# Patient Record
Sex: Female | Born: 1955
Health system: Southern US, Community
[De-identification: ages and names within clinical notes are randomized; demographics above are authoritative.]

## PROBLEM LIST (undated history)

## (undated) DIAGNOSIS — Z87442 Personal history of urinary calculi: Secondary | ICD-10-CM

## (undated) HISTORY — DX: Personal history of urinary calculi: Z87.442

---

## 2007-05-23 ENCOUNTER — Ambulatory Visit (HOSPITAL_COMMUNITY): Admission: RE | Admit: 2007-05-23 | Discharge: 2007-05-23 | Payer: Self-pay | Admitting: Urology

## 2008-07-22 IMAGING — CR DG ABDOMEN 1V
1 series · 1 of 1 positions shown · non-contrast
Comparison: none

CLINICAL DATA: Distal right ureteral stone.  Pre-op workup.
ABDOMEN ? 1 VIEW ? 05/23/07: 
No prior radiograph for correlation.

[view not recorded]
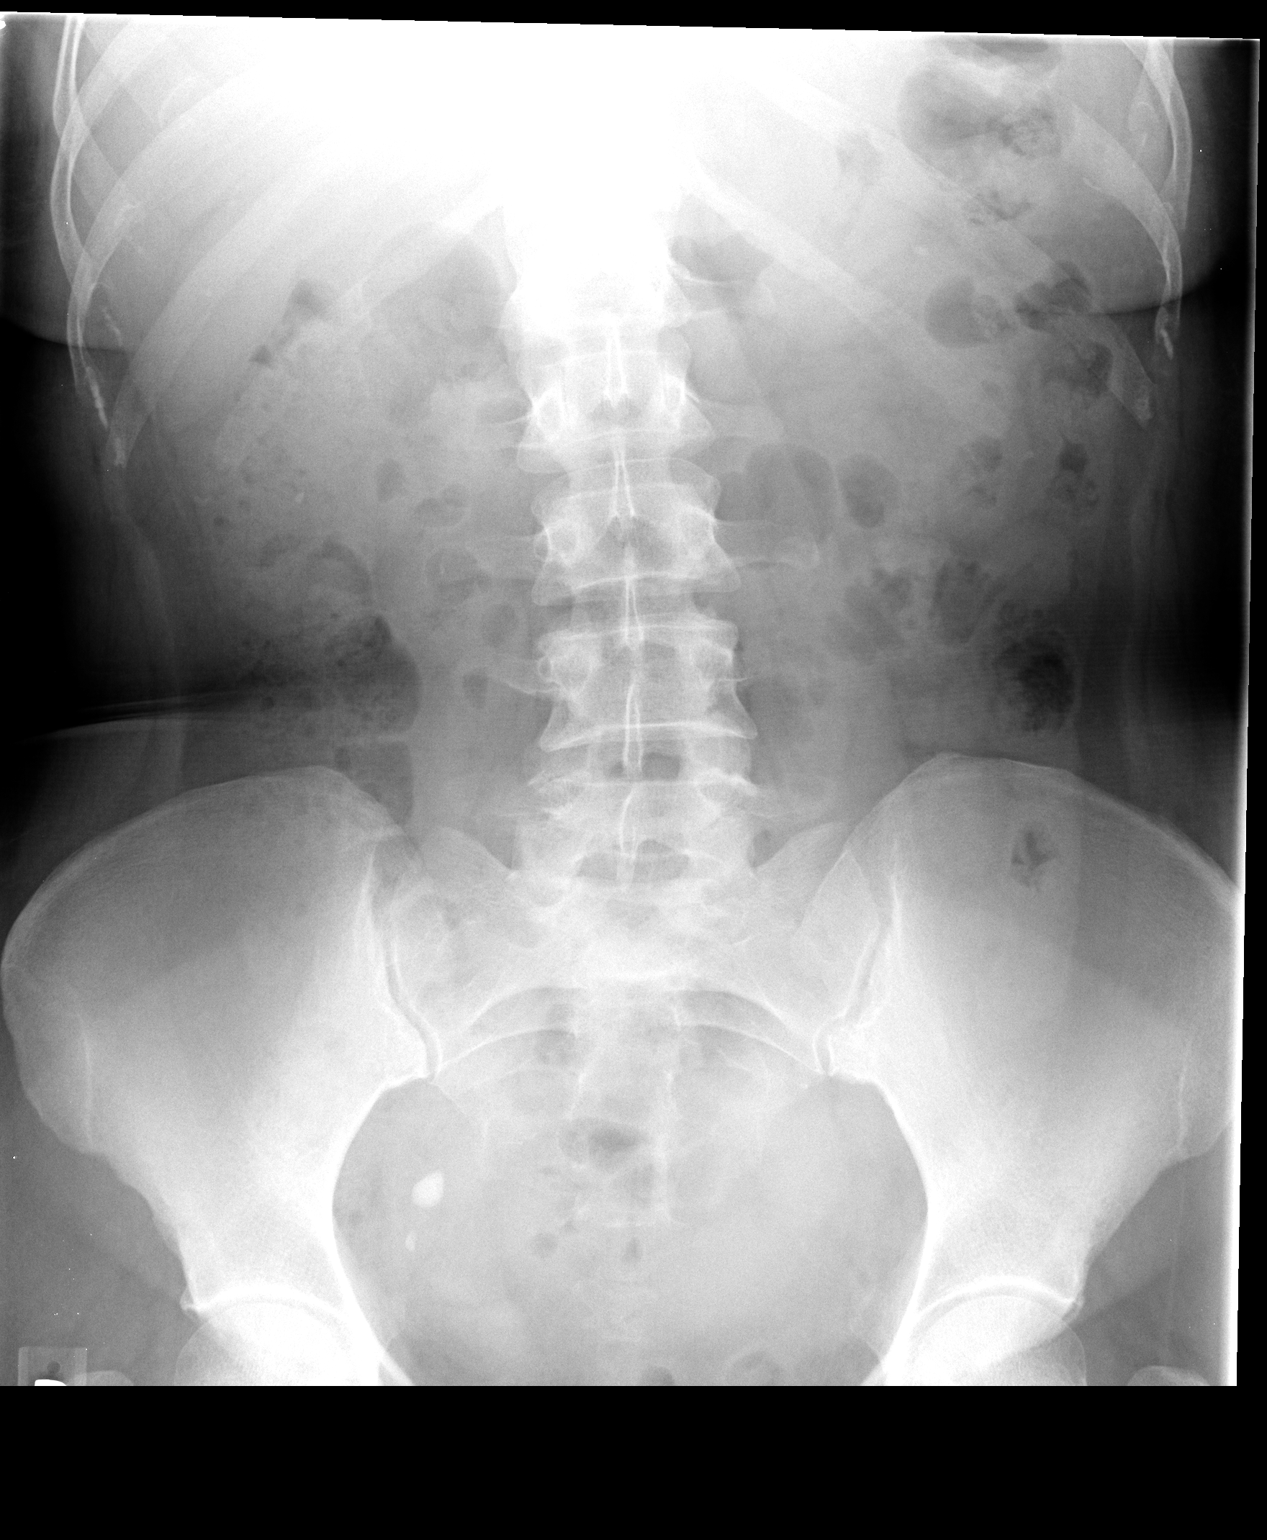

[1 of 1 positions shown; findings below may reference images not displayed]

FINDINGS: A 1x3mm calcification in the right mid to upper abdomen may be related to the right kidney.  A 3 x 4 mm calcification in the left upper quadrant may lie within the left kidney.  There are two calcifications in the right true pelvis, one measuring 8 x 11 mm and the other measuring 3 x 5 mm.  These may represent distal right ureteral stones and/or phleboliths.  The bowel gas pattern is unremarkable.   The bony structures are intact.
IMPRESSION: Suspicion for small bilateral renal calculi and distal right ureteral calculi.

## 2010-12-30 LAB — BASIC METABOLIC PANEL
CO2: 28
Chloride: 106
Glucose, Bld: 100 — ABNORMAL HIGH
Potassium: 4
Sodium: 140

## 2010-12-30 LAB — URINE MICROSCOPIC-ADD ON

## 2010-12-30 LAB — CBC
HCT: 39.8
Hemoglobin: 13.7
MCHC: 34.5
MCV: 87.5
RDW: 12.4

## 2010-12-30 LAB — URINALYSIS, ROUTINE W REFLEX MICROSCOPIC
Bilirubin Urine: NEGATIVE
Protein, ur: NEGATIVE
Urobilinogen, UA: 0.2

## 2017-05-02 DIAGNOSIS — N200 Calculus of kidney: Secondary | ICD-10-CM | POA: Insufficient documentation

## 2017-05-02 DIAGNOSIS — R310 Gross hematuria: Secondary | ICD-10-CM | POA: Insufficient documentation

## 2017-05-14 DIAGNOSIS — N201 Calculus of ureter: Secondary | ICD-10-CM | POA: Diagnosis not present

## 2017-05-14 DIAGNOSIS — N210 Calculus in bladder: Secondary | ICD-10-CM | POA: Diagnosis not present

## 2017-05-14 DIAGNOSIS — R310 Gross hematuria: Secondary | ICD-10-CM | POA: Diagnosis not present

## 2017-05-22 DIAGNOSIS — N200 Calculus of kidney: Secondary | ICD-10-CM | POA: Diagnosis not present

## 2017-05-22 DIAGNOSIS — N1330 Unspecified hydronephrosis: Secondary | ICD-10-CM | POA: Diagnosis not present

## 2017-05-22 DIAGNOSIS — Z9889 Other specified postprocedural states: Secondary | ICD-10-CM | POA: Diagnosis not present

## 2017-05-22 DIAGNOSIS — R8290 Unspecified abnormal findings in urine: Secondary | ICD-10-CM | POA: Diagnosis not present

## 2017-05-22 DIAGNOSIS — N3000 Acute cystitis without hematuria: Secondary | ICD-10-CM | POA: Insufficient documentation

## 2017-05-22 DIAGNOSIS — N201 Calculus of ureter: Secondary | ICD-10-CM | POA: Diagnosis not present

## 2017-06-01 DIAGNOSIS — N200 Calculus of kidney: Secondary | ICD-10-CM | POA: Diagnosis not present

## 2017-06-14 DIAGNOSIS — Z87442 Personal history of urinary calculi: Secondary | ICD-10-CM | POA: Diagnosis not present

## 2017-07-31 DIAGNOSIS — Z87442 Personal history of urinary calculi: Secondary | ICD-10-CM | POA: Diagnosis not present

## 2017-07-31 DIAGNOSIS — R82994 Hypercalciuria: Secondary | ICD-10-CM | POA: Insufficient documentation

## 2017-09-25 DIAGNOSIS — R87612 Low grade squamous intraepithelial lesion on cytologic smear of cervix (LGSIL): Secondary | ICD-10-CM | POA: Diagnosis not present

## 2017-09-25 DIAGNOSIS — Z0000 Encounter for general adult medical examination without abnormal findings: Secondary | ICD-10-CM | POA: Diagnosis not present

## 2017-09-30 DIAGNOSIS — Z0000 Encounter for general adult medical examination without abnormal findings: Secondary | ICD-10-CM | POA: Insufficient documentation

## 2017-11-06 DIAGNOSIS — R87610 Atypical squamous cells of undetermined significance on cytologic smear of cervix (ASC-US): Secondary | ICD-10-CM | POA: Diagnosis not present

## 2017-11-06 DIAGNOSIS — Z124 Encounter for screening for malignant neoplasm of cervix: Secondary | ICD-10-CM | POA: Diagnosis not present

## 2017-11-06 DIAGNOSIS — Z01419 Encounter for gynecological examination (general) (routine) without abnormal findings: Secondary | ICD-10-CM | POA: Diagnosis not present

## 2017-11-06 DIAGNOSIS — Z6829 Body mass index (BMI) 29.0-29.9, adult: Secondary | ICD-10-CM | POA: Diagnosis not present

## 2017-11-12 DIAGNOSIS — M8588 Other specified disorders of bone density and structure, other site: Secondary | ICD-10-CM | POA: Diagnosis not present

## 2017-11-12 DIAGNOSIS — N959 Unspecified menopausal and perimenopausal disorder: Secondary | ICD-10-CM | POA: Diagnosis not present

## 2018-01-09 DIAGNOSIS — Z1231 Encounter for screening mammogram for malignant neoplasm of breast: Secondary | ICD-10-CM | POA: Diagnosis not present

## 2018-01-09 DIAGNOSIS — Z6829 Body mass index (BMI) 29.0-29.9, adult: Secondary | ICD-10-CM | POA: Diagnosis not present

## 2018-01-25 DIAGNOSIS — Z23 Encounter for immunization: Secondary | ICD-10-CM | POA: Diagnosis not present

## 2018-04-24 DIAGNOSIS — R350 Frequency of micturition: Secondary | ICD-10-CM | POA: Diagnosis not present

## 2018-04-24 DIAGNOSIS — N393 Stress incontinence (female) (male): Secondary | ICD-10-CM | POA: Diagnosis not present

## 2018-04-24 DIAGNOSIS — Z87442 Personal history of urinary calculi: Secondary | ICD-10-CM | POA: Diagnosis not present

## 2018-04-24 DIAGNOSIS — N8112 Cystocele, lateral: Secondary | ICD-10-CM | POA: Diagnosis not present

## 2018-05-01 DIAGNOSIS — J101 Influenza due to other identified influenza virus with other respiratory manifestations: Secondary | ICD-10-CM | POA: Diagnosis not present

## 2018-05-01 DIAGNOSIS — R05 Cough: Secondary | ICD-10-CM | POA: Diagnosis not present

## 2018-05-01 DIAGNOSIS — Z20828 Contact with and (suspected) exposure to other viral communicable diseases: Secondary | ICD-10-CM | POA: Diagnosis not present

## 2018-05-01 DIAGNOSIS — R509 Fever, unspecified: Secondary | ICD-10-CM | POA: Diagnosis not present

## 2018-05-09 DIAGNOSIS — Z01810 Encounter for preprocedural cardiovascular examination: Secondary | ICD-10-CM | POA: Diagnosis not present

## 2018-05-09 DIAGNOSIS — I517 Cardiomegaly: Secondary | ICD-10-CM | POA: Diagnosis not present

## 2018-05-10 DIAGNOSIS — Z539 Procedure and treatment not carried out, unspecified reason: Secondary | ICD-10-CM | POA: Diagnosis not present

## 2018-05-10 DIAGNOSIS — N8112 Cystocele, lateral: Secondary | ICD-10-CM | POA: Diagnosis not present

## 2018-05-11 HISTORY — PX: BLADDER SUSPENSION: SHX72

## 2018-05-15 DIAGNOSIS — D696 Thrombocytopenia, unspecified: Secondary | ICD-10-CM | POA: Diagnosis not present

## 2018-05-17 DIAGNOSIS — D696 Thrombocytopenia, unspecified: Secondary | ICD-10-CM | POA: Diagnosis not present

## 2018-05-22 DIAGNOSIS — N8112 Cystocele, lateral: Secondary | ICD-10-CM | POA: Diagnosis not present

## 2018-05-22 DIAGNOSIS — N393 Stress incontinence (female) (male): Secondary | ICD-10-CM | POA: Diagnosis not present

## 2018-05-23 DIAGNOSIS — N8112 Cystocele, lateral: Secondary | ICD-10-CM | POA: Diagnosis not present

## 2018-05-23 DIAGNOSIS — N393 Stress incontinence (female) (male): Secondary | ICD-10-CM | POA: Diagnosis not present

## 2018-06-05 DIAGNOSIS — Z87442 Personal history of urinary calculi: Secondary | ICD-10-CM | POA: Diagnosis not present

## 2018-06-05 DIAGNOSIS — Z87448 Personal history of other diseases of urinary system: Secondary | ICD-10-CM | POA: Insufficient documentation

## 2018-09-06 DIAGNOSIS — Z87442 Personal history of urinary calculi: Secondary | ICD-10-CM | POA: Diagnosis not present

## 2018-09-06 DIAGNOSIS — Z87448 Personal history of other diseases of urinary system: Secondary | ICD-10-CM | POA: Diagnosis not present

## 2018-09-23 DIAGNOSIS — W540XXA Bitten by dog, initial encounter: Secondary | ICD-10-CM | POA: Diagnosis not present

## 2018-09-23 DIAGNOSIS — Z23 Encounter for immunization: Secondary | ICD-10-CM | POA: Diagnosis not present

## 2018-09-23 DIAGNOSIS — S80812A Abrasion, left lower leg, initial encounter: Secondary | ICD-10-CM | POA: Diagnosis not present

## 2018-11-07 ENCOUNTER — Encounter: Payer: Self-pay | Admitting: Gastroenterology

## 2018-12-20 ENCOUNTER — Ambulatory Visit (AMBULATORY_SURGERY_CENTER): Payer: Self-pay | Admitting: *Deleted

## 2018-12-20 ENCOUNTER — Encounter: Payer: Self-pay | Admitting: Gastroenterology

## 2018-12-20 ENCOUNTER — Other Ambulatory Visit: Payer: Self-pay

## 2018-12-20 VITALS — Temp 97.3°F | Ht 62.0 in | Wt 160.0 lb

## 2018-12-20 DIAGNOSIS — Z1211 Encounter for screening for malignant neoplasm of colon: Secondary | ICD-10-CM

## 2018-12-20 MED ORDER — CLENPIQ 10-3.5-12 MG-GM -GM/160ML PO SOLN: 1.0000 | ORAL | 0 refills | Status: DC

## 2018-12-20 NOTE — Progress Notes (Signed)
Patient is here in-person for PV. Patient denies any allergies to eggs or soy. Patient denies any problems with anesthesia/sedation. Patient denies any oxygen use at home. Patient denies taking any diet/weight loss medications or blood thinners. EMMI education assisgned to patient on colonoscopy, this was explained and instructions given to patient. Clenpiq coupon given to pt. BUN/Creat. Is normal per care everywhere.    Pt is aware that care partner will wait in the car during procedure; if they feel like they will be too hot to wait in the car; they may wait in the lobby.  We want them to wear a mask (we do not have any that we can provide them), practice social distancing, and we will check their temperatures when they get here.  I did remind patient that their care partner needs to stay in the parking lot the entire time. Pt will wear mask into building.

## 2019-01-02 ENCOUNTER — Telehealth: Payer: Self-pay

## 2019-01-02 NOTE — Telephone Encounter (Signed)
Covid-19 screening questions   Do you now or have you had a fever in the last 14 days? NO   Do you have any respiratory symptoms of shortness of breath or cough now or in the last 14 days? NO  Do you have any family members or close contacts with diagnosed or suspected Covid-19 in the past 14 days? NO  Have you been tested for Covid-19 and found to be positive? NO        

## 2019-01-03 ENCOUNTER — Encounter: Payer: Self-pay | Admitting: Gastroenterology

## 2019-01-03 ENCOUNTER — Ambulatory Visit (AMBULATORY_SURGERY_CENTER): Payer: Federal, State, Local not specified - PPO | Admitting: Gastroenterology

## 2019-01-03 ENCOUNTER — Other Ambulatory Visit: Payer: Self-pay

## 2019-01-03 VITALS — BP 114/80 | HR 90 | Temp 97.7°F | Resp 15 | Ht 62.0 in | Wt 160.0 lb

## 2019-01-03 DIAGNOSIS — Z1211 Encounter for screening for malignant neoplasm of colon: Secondary | ICD-10-CM | POA: Diagnosis not present

## 2019-01-03 MED ORDER — SODIUM CHLORIDE 0.9 % IV SOLN: 500.0000 mL | Freq: Once | INTRAVENOUS | Status: DC

## 2019-01-03 NOTE — Op Note (Signed)
[facility] Patient Name: Sally NorrieJoyce Honeycutt Procedure Date: 01/03/2019 10:01 AM MRN: [id number] Endoscopist: Lynann Bolognaajesh Tereka Thorley , MD Age: 63 Referring MD:  Date of Birth: 04/05/1956 Gender: Female Account #: [id number] Procedure:                Colonoscopy Indications:              Screening for colorectal malignant neoplasm Medicines:                Monitored Anesthesia Care Procedure:                Pre-Anesthesia Assessment:                           - Prior to the procedure, a History and Physical                            was performed, and patient medications and                            allergies were reviewed. The patient's tolerance of                            previous anesthesia was also reviewed. The risks                            and benefits of the procedure and the sedation                            options and risks were discussed with the patient.                            All questions were answered, and informed consent                            was obtained. Prior Anticoagulants: The patient has                            taken no previous anticoagulant or antiplatelet                            agents. ASA Grade Assessment: I - A normal, healthy                            patient. After reviewing the risks and benefits,                            the patient was deemed in satisfactory condition to                            undergo the procedure.                           After obtaining informed consent, the colonoscope  was passed under direct vision. Throughout the                            procedure, the patient's blood pressure, pulse, and                            oxygen saturations were monitored continuously. The                            Colonoscope was introduced through the anus and                            advanced to the the cecum, identified by                            appendiceal orifice and ileocecal valve.  Thereafter                            2 cm of TI was intubated. The colonoscopy was                            performed without difficulty. The patient tolerated                            the procedure well. The quality of the bowel                            preparation was good. The terminal ileum, ileocecal                            valve, appendiceal orifice, and rectum were                            photographed. Scope In: 10:05:32 AM Scope Out: 10:19:15 AM Scope Withdrawal Time: 0 hours 10 minutes 11 seconds  Total Procedure Duration: 0 hours 13 minutes 43 seconds  Findings:                 Multiple medium-mouthed diverticula were found in                            the sigmoid colon and descending colon. Few                            diverticula had stool impacted. There is no                            endoscopic evidence of diverticulitis. There was                            some luminal narrowing consistent with muscular                            hypertrophy in the sigmoid colon.  Non-bleeding internal hemorrhoids were found during                            retroflexion. The hemorrhoids were small.                           The exam was otherwise without abnormality on                            direct and retroflexion views. Complications:            No immediate complications. Estimated Blood Loss:     Estimated blood loss: none. Impression:               -Moderate left colonic diverticulosis.                           -Small internal hemorrhoids.                           -Otherwise normal colonoscopy to TI. Recommendation:           - Patient has a contact number available for                            emergencies. The signs and symptoms of potential                            delayed complications were discussed with the                            patient. Return to normal activities tomorrow.                            Written discharge  instructions were provided to the                            patient.                           - High fiber diet.                           - Continue present medications.                           - Repeat colonoscopy in 10 years for screening                            purposes. Earlier, if with any new problems or if                            there is any change in family history.                           - Return to GI office PRN. Lynann Bologna, MD 01/03/2019 10:28:58 AM This report has been signed electronically.

## 2019-01-03 NOTE — Progress Notes (Signed)
KA - Temp CW- VS  Pt's states no medical or surgical changes since previsit or office visit.   

## 2019-01-03 NOTE — Progress Notes (Signed)
Report to PACU, RN, vss, BBS= Clear.  

## 2019-01-03 NOTE — Patient Instructions (Signed)
YOU HAD AN ENDOSCOPIC PROCEDURE TODAY AT THE [facility]:   Refer to the procedure report that was given to you for any specific questions about what was found during the examination.  If the procedure report does not answer your questions, please call your gastroenterologist to clarify.  If you requested that your care partner not be given the details of your procedure findings, then the procedure report has been included in a sealed envelope for you to review at your convenience later.  YOU SHOULD EXPECT: Some feelings of bloating in the abdomen. Passage of more gas than usual.  Walking can help get rid of the air that was put into your GI tract during the procedure and reduce the bloating. If you had a lower endoscopy (such as a colonoscopy or flexible sigmoidoscopy) you may notice spotting of blood in your stool or on the toilet paper. If you underwent a bowel prep for your procedure, you may not have a normal bowel movement for a few days.  Please Note:  You might notice some irritation and congestion in your nose or some drainage.  This is from the oxygen used during your procedure.  There is no need for concern and it should clear up in a day or so.  SYMPTOMS TO REPORT IMMEDIATELY:   Following lower endoscopy (colonoscopy or flexible sigmoidoscopy):  Excessive amounts of blood in the stool  Significant tenderness or worsening of abdominal pains  Swelling of the abdomen that is new, acute  Fever of 100F or higher  For urgent or emergent issues, a gastroenterologist can be reached at any hour by calling [phone].   DIET:  We do recommend a small meal at first, but then you may proceed to your regular diet.  Drink plenty of fluids but you should avoid alcoholic beverages for 24 hours.  ACTIVITY:  You should plan to take it easy for the rest of today and you should NOT DRIVE or use heavy machinery until tomorrow (because of the sedation medicines used during the test).     FOLLOW UP: Our staff will call the number listed on your records 48-72 hours following your procedure to check on you and address any questions or concerns that you may have regarding the information given to you following your procedure. If we do not reach you, we will leave a message.  We will attempt to reach you two times.  During this call, we will ask if you have developed any symptoms of COVID 19. If you develop any symptoms (ie: fever, flu-like symptoms, shortness of breath, cough etc.) before then, please call [phone].  If you test positive for Covid 19 in the 2 weeks post procedure, please call and report this information to us.    If any biopsies were taken you will be contacted by phone or by letter within the next 1-3 weeks.  Please call us at [phone] if you have not heard about the biopsies in 3 weeks.    SIGNATURES/CONFIDENTIALITY: You and/or your care partner have signed paperwork which will be entered into your electronic medical record.  These signatures attest to the fact that that the information above on your After Visit Summary has been reviewed and is understood.  Full responsibility of the confidentiality of this discharge information lies with you and/or your care-partner. 

## 2019-01-07 ENCOUNTER — Telehealth: Payer: Self-pay | Admitting: *Deleted

## 2019-01-07 NOTE — Telephone Encounter (Signed)
  Follow up Call-  Call back number 01/03/2019  Post procedure Call Back phone  # [phone] hm  Permission to leave phone message Yes  Some recent data might be hidden     Patient questions:  Do you have a fever, pain , or abdominal swelling? No. Pain Score  0 *  Have you tolerated food without any problems? Yes.    Have you been able to return to your normal activities? Yes.    Do you have any questions about your discharge instructions: Diet   No. Medications  No. Follow up visit  No.  Do you have questions or concerns about your Care? No.  Actions: * If pain score is 4 or above: No action needed, pain <4.   1. Have you developed a fever since your procedure? no  2.   Have you had an respiratory symptoms (SOB or cough) since your procedure? no  3.   Have you tested positive for COVID 19 since your procedure no  4.   Have you had any family members/close contacts diagnosed with the COVID 19 since your procedure?  no   If yes to any of these questions please route to Joylene John, RN and Alphonsa Gin, Therapist, sports.

## 2019-01-13 DIAGNOSIS — Z23 Encounter for immunization: Secondary | ICD-10-CM | POA: Diagnosis not present

## 2019-03-05 DIAGNOSIS — R87610 Atypical squamous cells of undetermined significance on cytologic smear of cervix (ASC-US): Secondary | ICD-10-CM | POA: Diagnosis not present

## 2019-03-05 DIAGNOSIS — Z01419 Encounter for gynecological examination (general) (routine) without abnormal findings: Secondary | ICD-10-CM | POA: Diagnosis not present

## 2019-03-05 DIAGNOSIS — Z1231 Encounter for screening mammogram for malignant neoplasm of breast: Secondary | ICD-10-CM | POA: Diagnosis not present

## 2019-03-05 DIAGNOSIS — Z124 Encounter for screening for malignant neoplasm of cervix: Secondary | ICD-10-CM | POA: Diagnosis not present

## 2019-03-05 DIAGNOSIS — Z6829 Body mass index (BMI) 29.0-29.9, adult: Secondary | ICD-10-CM | POA: Diagnosis not present

## 2019-03-26 DIAGNOSIS — H2513 Age-related nuclear cataract, bilateral: Secondary | ICD-10-CM | POA: Diagnosis not present

## 2019-03-26 DIAGNOSIS — H35371 Puckering of macula, right eye: Secondary | ICD-10-CM | POA: Diagnosis not present

## 2019-03-27 DIAGNOSIS — R87810 Cervical high risk human papillomavirus (HPV) DNA test positive: Secondary | ICD-10-CM | POA: Diagnosis not present

## 2019-03-27 DIAGNOSIS — Z6829 Body mass index (BMI) 29.0-29.9, adult: Secondary | ICD-10-CM | POA: Diagnosis not present

## 2019-03-27 DIAGNOSIS — R87610 Atypical squamous cells of undetermined significance on cytologic smear of cervix (ASC-US): Secondary | ICD-10-CM | POA: Diagnosis not present

## 2019-04-30 DIAGNOSIS — Z13220 Encounter for screening for lipoid disorders: Secondary | ICD-10-CM | POA: Diagnosis not present

## 2019-04-30 DIAGNOSIS — Z1331 Encounter for screening for depression: Secondary | ICD-10-CM | POA: Diagnosis not present

## 2019-04-30 DIAGNOSIS — M8580 Other specified disorders of bone density and structure, unspecified site: Secondary | ICD-10-CM | POA: Diagnosis not present

## 2019-04-30 DIAGNOSIS — R82994 Hypercalciuria: Secondary | ICD-10-CM | POA: Diagnosis not present

## 2019-04-30 DIAGNOSIS — Z131 Encounter for screening for diabetes mellitus: Secondary | ICD-10-CM | POA: Diagnosis not present

## 2020-01-05 DIAGNOSIS — R350 Frequency of micturition: Secondary | ICD-10-CM | POA: Diagnosis not present

## 2020-01-05 DIAGNOSIS — R310 Gross hematuria: Secondary | ICD-10-CM | POA: Diagnosis not present

## 2020-01-05 DIAGNOSIS — N132 Hydronephrosis with renal and ureteral calculous obstruction: Secondary | ICD-10-CM | POA: Diagnosis not present

## 2020-01-05 DIAGNOSIS — N201 Calculus of ureter: Secondary | ICD-10-CM | POA: Diagnosis not present

## 2020-01-05 DIAGNOSIS — Z87448 Personal history of other diseases of urinary system: Secondary | ICD-10-CM | POA: Diagnosis not present

## 2020-01-05 DIAGNOSIS — R319 Hematuria, unspecified: Secondary | ICD-10-CM | POA: Diagnosis not present

## 2020-01-05 DIAGNOSIS — R82994 Hypercalciuria: Secondary | ICD-10-CM | POA: Diagnosis not present

## 2020-01-05 DIAGNOSIS — K5730 Diverticulosis of large intestine without perforation or abscess without bleeding: Secondary | ICD-10-CM | POA: Diagnosis not present

## 2020-01-06 DIAGNOSIS — N202 Calculus of kidney with calculus of ureter: Secondary | ICD-10-CM | POA: Diagnosis not present

## 2020-01-06 DIAGNOSIS — N132 Hydronephrosis with renal and ureteral calculous obstruction: Secondary | ICD-10-CM | POA: Diagnosis not present

## 2020-01-06 DIAGNOSIS — N201 Calculus of ureter: Secondary | ICD-10-CM | POA: Diagnosis not present

## 2020-01-06 DIAGNOSIS — I517 Cardiomegaly: Secondary | ICD-10-CM | POA: Diagnosis not present

## 2020-01-08 DIAGNOSIS — N200 Calculus of kidney: Secondary | ICD-10-CM | POA: Diagnosis not present

## 2020-01-16 DIAGNOSIS — Z87442 Personal history of urinary calculi: Secondary | ICD-10-CM | POA: Diagnosis not present

## 2020-01-16 DIAGNOSIS — N201 Calculus of ureter: Secondary | ICD-10-CM | POA: Diagnosis not present

## 2020-02-05 DIAGNOSIS — Z23 Encounter for immunization: Secondary | ICD-10-CM | POA: Diagnosis not present

## 2020-03-12 DIAGNOSIS — Z6828 Body mass index (BMI) 28.0-28.9, adult: Secondary | ICD-10-CM | POA: Diagnosis not present

## 2020-03-12 DIAGNOSIS — Z124 Encounter for screening for malignant neoplasm of cervix: Secondary | ICD-10-CM | POA: Diagnosis not present

## 2020-03-12 DIAGNOSIS — Z01419 Encounter for gynecological examination (general) (routine) without abnormal findings: Secondary | ICD-10-CM | POA: Diagnosis not present

## 2020-03-12 DIAGNOSIS — R87810 Cervical high risk human papillomavirus (HPV) DNA test positive: Secondary | ICD-10-CM | POA: Diagnosis not present

## 2020-03-12 DIAGNOSIS — Z1231 Encounter for screening mammogram for malignant neoplasm of breast: Secondary | ICD-10-CM | POA: Diagnosis not present

## 2020-04-13 DIAGNOSIS — Z6828 Body mass index (BMI) 28.0-28.9, adult: Secondary | ICD-10-CM | POA: Diagnosis not present

## 2020-04-13 DIAGNOSIS — R87610 Atypical squamous cells of undetermined significance on cytologic smear of cervix (ASC-US): Secondary | ICD-10-CM | POA: Diagnosis not present

## 2020-04-13 DIAGNOSIS — R87810 Cervical high risk human papillomavirus (HPV) DNA test positive: Secondary | ICD-10-CM | POA: Diagnosis not present

## 2020-05-13 DIAGNOSIS — N201 Calculus of ureter: Secondary | ICD-10-CM | POA: Diagnosis not present

## 2020-05-20 DIAGNOSIS — R82994 Hypercalciuria: Secondary | ICD-10-CM | POA: Diagnosis not present

## 2020-05-20 DIAGNOSIS — N200 Calculus of kidney: Secondary | ICD-10-CM | POA: Diagnosis not present

## 2020-05-20 DIAGNOSIS — N201 Calculus of ureter: Secondary | ICD-10-CM | POA: Diagnosis not present

## 2020-05-25 DIAGNOSIS — N72 Inflammatory disease of cervix uteri: Secondary | ICD-10-CM | POA: Diagnosis not present

## 2020-05-25 DIAGNOSIS — Z3202 Encounter for pregnancy test, result negative: Secondary | ICD-10-CM | POA: Diagnosis not present

## 2020-05-25 DIAGNOSIS — R87810 Cervical high risk human papillomavirus (HPV) DNA test positive: Secondary | ICD-10-CM | POA: Diagnosis not present

## 2020-05-25 DIAGNOSIS — R87610 Atypical squamous cells of undetermined significance on cytologic smear of cervix (ASC-US): Secondary | ICD-10-CM | POA: Diagnosis not present

## 2020-05-25 DIAGNOSIS — Z6828 Body mass index (BMI) 28.0-28.9, adult: Secondary | ICD-10-CM | POA: Diagnosis not present

## 2020-11-13 DIAGNOSIS — K429 Umbilical hernia without obstruction or gangrene: Secondary | ICD-10-CM | POA: Diagnosis not present

## 2020-11-13 DIAGNOSIS — D72828 Other elevated white blood cell count: Secondary | ICD-10-CM | POA: Diagnosis not present

## 2020-11-13 DIAGNOSIS — R222 Localized swelling, mass and lump, trunk: Secondary | ICD-10-CM | POA: Diagnosis not present

## 2020-11-13 DIAGNOSIS — N132 Hydronephrosis with renal and ureteral calculous obstruction: Secondary | ICD-10-CM | POA: Diagnosis not present

## 2020-11-13 DIAGNOSIS — R112 Nausea with vomiting, unspecified: Secondary | ICD-10-CM | POA: Diagnosis not present

## 2020-11-13 DIAGNOSIS — N201 Calculus of ureter: Secondary | ICD-10-CM | POA: Diagnosis not present

## 2020-11-17 DIAGNOSIS — R82994 Hypercalciuria: Secondary | ICD-10-CM | POA: Diagnosis not present

## 2020-11-17 DIAGNOSIS — N200 Calculus of kidney: Secondary | ICD-10-CM | POA: Diagnosis not present

## 2020-11-17 DIAGNOSIS — N201 Calculus of ureter: Secondary | ICD-10-CM | POA: Diagnosis not present

## 2020-11-20 DIAGNOSIS — Z87442 Personal history of urinary calculi: Secondary | ICD-10-CM | POA: Diagnosis not present

## 2020-11-20 DIAGNOSIS — N201 Calculus of ureter: Secondary | ICD-10-CM | POA: Diagnosis not present

## 2020-12-03 DIAGNOSIS — Z466 Encounter for fitting and adjustment of urinary device: Secondary | ICD-10-CM | POA: Diagnosis not present

## 2020-12-03 DIAGNOSIS — N201 Calculus of ureter: Secondary | ICD-10-CM | POA: Diagnosis not present

## 2020-12-03 DIAGNOSIS — R8290 Unspecified abnormal findings in urine: Secondary | ICD-10-CM | POA: Diagnosis not present

## 2020-12-03 DIAGNOSIS — R82994 Hypercalciuria: Secondary | ICD-10-CM | POA: Diagnosis not present

## 2020-12-03 DIAGNOSIS — R82998 Other abnormal findings in urine: Secondary | ICD-10-CM | POA: Diagnosis not present

## 2020-12-03 DIAGNOSIS — R82991 Hypocitraturia: Secondary | ICD-10-CM | POA: Diagnosis not present

## 2021-01-29 DIAGNOSIS — Z23 Encounter for immunization: Secondary | ICD-10-CM | POA: Diagnosis not present

## 2021-02-11 DIAGNOSIS — Z0000 Encounter for general adult medical examination without abnormal findings: Secondary | ICD-10-CM | POA: Diagnosis not present

## 2021-02-16 DIAGNOSIS — E2839 Other primary ovarian failure: Secondary | ICD-10-CM | POA: Diagnosis not present

## 2021-02-16 DIAGNOSIS — Z0000 Encounter for general adult medical examination without abnormal findings: Secondary | ICD-10-CM | POA: Diagnosis not present

## 2021-02-16 DIAGNOSIS — Z6823 Body mass index (BMI) 23.0-23.9, adult: Secondary | ICD-10-CM | POA: Diagnosis not present

## 2021-02-16 DIAGNOSIS — Z1331 Encounter for screening for depression: Secondary | ICD-10-CM | POA: Diagnosis not present

## 2021-02-16 DIAGNOSIS — M8580 Other specified disorders of bone density and structure, unspecified site: Secondary | ICD-10-CM | POA: Diagnosis not present

## 2021-03-07 DIAGNOSIS — R82991 Hypocitraturia: Secondary | ICD-10-CM | POA: Diagnosis not present

## 2021-03-07 DIAGNOSIS — N200 Calculus of kidney: Secondary | ICD-10-CM | POA: Diagnosis not present

## 2021-03-07 DIAGNOSIS — R82994 Hypercalciuria: Secondary | ICD-10-CM | POA: Diagnosis not present

## 2021-04-27 DIAGNOSIS — Z01419 Encounter for gynecological examination (general) (routine) without abnormal findings: Secondary | ICD-10-CM | POA: Diagnosis not present

## 2021-04-27 DIAGNOSIS — Z1231 Encounter for screening mammogram for malignant neoplasm of breast: Secondary | ICD-10-CM | POA: Diagnosis not present

## 2021-04-27 DIAGNOSIS — Z6828 Body mass index (BMI) 28.0-28.9, adult: Secondary | ICD-10-CM | POA: Diagnosis not present

## 2021-04-27 DIAGNOSIS — R87612 Low grade squamous intraepithelial lesion on cytologic smear of cervix (LGSIL): Secondary | ICD-10-CM | POA: Diagnosis not present

## 2021-04-27 DIAGNOSIS — Z124 Encounter for screening for malignant neoplasm of cervix: Secondary | ICD-10-CM | POA: Diagnosis not present

## 2021-05-12 DIAGNOSIS — Z6828 Body mass index (BMI) 28.0-28.9, adult: Secondary | ICD-10-CM | POA: Diagnosis not present

## 2021-05-12 DIAGNOSIS — N72 Inflammatory disease of cervix uteri: Secondary | ICD-10-CM | POA: Diagnosis not present

## 2021-05-12 DIAGNOSIS — R87612 Low grade squamous intraepithelial lesion on cytologic smear of cervix (LGSIL): Secondary | ICD-10-CM | POA: Diagnosis not present

## 2021-05-12 DIAGNOSIS — R87810 Cervical high risk human papillomavirus (HPV) DNA test positive: Secondary | ICD-10-CM | POA: Diagnosis not present

## 2021-05-13 DIAGNOSIS — M8580 Other specified disorders of bone density and structure, unspecified site: Secondary | ICD-10-CM | POA: Diagnosis not present

## 2021-05-13 DIAGNOSIS — M8589 Other specified disorders of bone density and structure, multiple sites: Secondary | ICD-10-CM | POA: Diagnosis not present

## 2021-05-13 DIAGNOSIS — E2839 Other primary ovarian failure: Secondary | ICD-10-CM | POA: Diagnosis not present

## 2021-06-01 DIAGNOSIS — H35371 Puckering of macula, right eye: Secondary | ICD-10-CM | POA: Diagnosis not present

## 2022-01-18 DIAGNOSIS — Z23 Encounter for immunization: Secondary | ICD-10-CM | POA: Diagnosis not present

## 2022-02-20 DIAGNOSIS — Z0000 Encounter for general adult medical examination without abnormal findings: Secondary | ICD-10-CM | POA: Diagnosis not present

## 2022-02-22 DIAGNOSIS — M8580 Other specified disorders of bone density and structure, unspecified site: Secondary | ICD-10-CM | POA: Diagnosis not present

## 2022-02-22 DIAGNOSIS — M21612 Bunion of left foot: Secondary | ICD-10-CM | POA: Diagnosis not present

## 2022-02-22 DIAGNOSIS — Z0000 Encounter for general adult medical examination without abnormal findings: Secondary | ICD-10-CM | POA: Diagnosis not present

## 2022-02-22 DIAGNOSIS — Z1331 Encounter for screening for depression: Secondary | ICD-10-CM | POA: Diagnosis not present

## 2022-05-08 DIAGNOSIS — Z124 Encounter for screening for malignant neoplasm of cervix: Secondary | ICD-10-CM | POA: Diagnosis not present

## 2022-05-08 DIAGNOSIS — Z6829 Body mass index (BMI) 29.0-29.9, adult: Secondary | ICD-10-CM | POA: Diagnosis not present

## 2022-05-08 DIAGNOSIS — Z01419 Encounter for gynecological examination (general) (routine) without abnormal findings: Secondary | ICD-10-CM | POA: Diagnosis not present

## 2022-05-08 DIAGNOSIS — Z1231 Encounter for screening mammogram for malignant neoplasm of breast: Secondary | ICD-10-CM | POA: Diagnosis not present

## 2022-05-29 DIAGNOSIS — R87810 Cervical high risk human papillomavirus (HPV) DNA test positive: Secondary | ICD-10-CM | POA: Diagnosis not present

## 2022-06-07 DIAGNOSIS — H53143 Visual discomfort, bilateral: Secondary | ICD-10-CM | POA: Diagnosis not present

## 2022-06-13 DIAGNOSIS — N200 Calculus of kidney: Secondary | ICD-10-CM | POA: Diagnosis not present

## 2022-06-13 DIAGNOSIS — R8290 Unspecified abnormal findings in urine: Secondary | ICD-10-CM | POA: Diagnosis not present

## 2023-02-26 DIAGNOSIS — Z79899 Other long term (current) drug therapy: Secondary | ICD-10-CM | POA: Diagnosis not present

## 2023-02-26 DIAGNOSIS — E7800 Pure hypercholesterolemia, unspecified: Secondary | ICD-10-CM | POA: Diagnosis not present

## 2023-02-26 DIAGNOSIS — Z131 Encounter for screening for diabetes mellitus: Secondary | ICD-10-CM | POA: Diagnosis not present

## 2023-02-28 DIAGNOSIS — E7800 Pure hypercholesterolemia, unspecified: Secondary | ICD-10-CM | POA: Diagnosis not present

## 2023-02-28 DIAGNOSIS — E2839 Other primary ovarian failure: Secondary | ICD-10-CM | POA: Diagnosis not present

## 2023-02-28 DIAGNOSIS — Z0000 Encounter for general adult medical examination without abnormal findings: Secondary | ICD-10-CM | POA: Diagnosis not present

## 2023-02-28 DIAGNOSIS — Z9181 History of falling: Secondary | ICD-10-CM | POA: Diagnosis not present

## 2023-02-28 DIAGNOSIS — Z1331 Encounter for screening for depression: Secondary | ICD-10-CM | POA: Diagnosis not present

## 2023-02-28 DIAGNOSIS — M8580 Other specified disorders of bone density and structure, unspecified site: Secondary | ICD-10-CM | POA: Diagnosis not present

## 2023-05-02 DIAGNOSIS — N200 Calculus of kidney: Secondary | ICD-10-CM | POA: Diagnosis not present

## 2023-05-02 DIAGNOSIS — R8290 Unspecified abnormal findings in urine: Secondary | ICD-10-CM | POA: Diagnosis not present

## 2023-05-15 DIAGNOSIS — Z01419 Encounter for gynecological examination (general) (routine) without abnormal findings: Secondary | ICD-10-CM | POA: Diagnosis not present

## 2023-05-15 DIAGNOSIS — Z1231 Encounter for screening mammogram for malignant neoplasm of breast: Secondary | ICD-10-CM | POA: Diagnosis not present
